# Patient Record
Sex: Female | Born: 1970 | Race: White | Hispanic: No | Marital: Married | Smoking: Never smoker
Health system: Southern US, Community
[De-identification: ages and names within clinical notes are randomized; demographics above are authoritative.]

---

## 2009-05-17 ENCOUNTER — Emergency Department: Payer: Self-pay | Admitting: Emergency Medicine

## 2012-08-02 ENCOUNTER — Inpatient Hospital Stay: Payer: Self-pay | Admitting: Obstetrics and Gynecology

## 2012-08-02 LAB — CBC WITH DIFFERENTIAL/PLATELET
Basophil %: 0.3 %
Eosinophil #: 0 10*3/uL (ref 0.0–0.7)
Eosinophil %: 0.3 %
HCT: 37.1 % (ref 35.0–47.0)
Lymphocyte #: 1.2 10*3/uL (ref 1.0–3.6)
Lymphocyte %: 12 %
MCH: 30 pg (ref 26.0–34.0)
MCHC: 34 g/dL (ref 32.0–36.0)
Monocyte %: 6.2 %
Neutrophil #: 8.2 10*3/uL — ABNORMAL HIGH (ref 1.4–6.5)
Neutrophil %: 81.2 %
Platelet: 185 10*3/uL (ref 150–440)
RBC: 4.21 10*6/uL (ref 3.80–5.20)
RDW: 14.5 % (ref 11.5–14.5)

## 2012-08-04 LAB — CBC WITH DIFFERENTIAL/PLATELET
Eosinophil #: 0 10*3/uL (ref 0.0–0.7)
Eosinophil %: 0.1 %
HGB: 12.8 g/dL (ref 12.0–16.0)
MCH: 30.2 pg (ref 26.0–34.0)
MCHC: 34.3 g/dL (ref 32.0–36.0)
MCV: 88 fL (ref 80–100)
Neutrophil %: 84.7 %
Platelet: 168 10*3/uL (ref 150–440)
RBC: 4.23 10*6/uL (ref 3.80–5.20)
RDW: 14.6 % — ABNORMAL HIGH (ref 11.5–14.5)
WBC: 11.1 10*3/uL — ABNORMAL HIGH (ref 3.6–11.0)

## 2012-08-06 LAB — HEMATOCRIT: HCT: 34.3 % — ABNORMAL LOW (ref 35.0–47.0)

## 2012-08-08 LAB — PATHOLOGY REPORT

## 2013-12-15 ENCOUNTER — Encounter: Payer: Self-pay | Admitting: Surgery

## 2013-12-20 LAB — WOUND AEROBIC CULTURE

## 2013-12-28 ENCOUNTER — Encounter: Payer: Self-pay | Admitting: Surgery

## 2014-07-20 NOTE — Op Note (Signed)
PATIENT NAME:  Natalie Garrett, Natalie Garrett MR#:  [id number] DATE OF BIRTH:  [birth year]  DATE OF PROCEDURE:    PREOPERATIVE DIAGNOSIS:  Arrest of second stage of labor.   POSTOPERATIVE DIAGNOSIS:  Arrest of second stage of labor.  PROCEDURE:  Low transverse cesarean section and bilateral tubal ligation.    ANESTHESIA:  Epidural.   SURGEON:  Senaida Lange, M.D.   ASSISTANTLawson Fiscal, scrub tech.  ESTIMATED BLOOD LOSS:  500 mL.   OPERATIVE FLUIDS:  1 liter.   COMPLICATIONS:  None.   FINDINGS:  Vertex female infant, 3710 grams, Apgars 9 and 9, normal uterus, tubes, and ovaries, two fibroids-14 cm right fundal, one 1-cm posterior right fundal.    SPECIMEN:  Portion of right and left tube, cord blood and gas.   INDICATIONS:  The patient is a 44 year old who presents for induction of labor for post-dates.  The patient had Cervidil and Pitocin induction over a two-day period.  She did not progress passed 6 cm after adequate labor of 16 hours and was therefore delivered via cesarean section.  Risks, benefits, indications, and alternatives of the procedure were explained and informed consent was obtained.   DESCRIPTION OF PROCEDURE:  The patient was taken to the operating room with IV fluids running.  Spinal epidural was dosed up.  She was prepped and draped in the usual sterile fashion with a leftward tilt.  A Pfannenstiel skin incision was made and carried down to the underlying fascia with the knife.  The fascia was nicked in the midline.  The incision was extended laterally.  The superior aspect of the fascia was grasped with Kocher clamps and the underlying rectus muscle dissected off.  This was repeated on the inferior fascia.  The rectus muscles were divided in the midline.  The peritoneum was entered bluntly.  The opening was extended.  A bladder blade was placed.  The vesicouterine peritoneum was grasped with the pick-ups and entered sharply with the Metzenbaum.  The bladder flap was created digitally.   The bladder blade was replaced and the hysterotomy incision was made with one swipe of the scalpel.  At which point, thick green pea soup meconium spilled from the incision.  The incision was extended.  The infant's head was grasped and delivered atraumatically through the hysterotomy incision.  Nuchal cord x 1 was reduced.  The anterior and posterior shoulders were removed followed by the remainder of the body.  The cord was clamped x 2 and cut and the infant was handed to the awaiting nursery staff.  A portion of the cord was obtained for cord gas.  The placenta was expressed.  The uterus was exteriorized and cleared of all clot and debris.  The hysterotomy incision was repaired with #0 Monocryl in a running locked fashion.  Attention was turned to the patient's right tube where it was grasped with Tanja Port and an opening made in the avascular window in the mesosalpinx.  Two pieces of plain gut suture were passed through the opening and the tube was tied to approximately 2 to 3 cm lateral to the uterine cornu.  This was repeated on the patient's left tube.  The uterus was returned to the abdomen.  The abdomen and gutters were irrigated with copious amounts of warm normal saline.  The peritoneum was repaired with a #2-0.  The On-Q pump apparatus was placed according to manufacturer's instructions.  The fascia was repaired with a #1 PDS.  The skin was closed with staples.  The  subcutaneous tissue had been reapproximated with #2-0 Vicryl.  The On-Q pump catheters were each bolused with 0.5% Sensorcaine.  The openings through which the catheters were placed on the skin were closed with Dermabond skin glue and the catheters were secured to the patient's abdomen using Steri-Strips and Tegaderm.  The patient tolerated the procedure well.  Sponge, lap, needle and instrument counts were correct x 2 and the patient was taken to the recovery room in stable condition.      ____________________________ Sonda PrimesLashawn A.  Patton SallesWeaver-Lee, MD law:ea D: 08/05/2012 01:46:18 ET T: 08/05/2012 04:32:29 ET JOB#: [id number]  cc: Flint MelterLashawn A. Patton SallesWeaver-Lee, MD, <Dictator> Janyth ContesLASHAWN A WEAVER LEE MD ELECTRONICALLY SIGNED 08/09/2012 14:27

## 2017-08-22 ENCOUNTER — Other Ambulatory Visit
Admission: RE | Admit: 2017-08-22 | Discharge: 2017-08-22 | Disposition: A | Discharge status: Home / Self Care | Payer: Commercial Managed Care - PPO | Attending: *Deleted | Admitting: *Deleted

## 2017-08-22 ENCOUNTER — Emergency Department
Admission: EM | Admit: 2017-08-22 | Discharge: 2017-08-22 | Disposition: A | Discharge status: Home / Self Care | Payer: Commercial Managed Care - PPO | Attending: Emergency Medicine | Admitting: Emergency Medicine

## 2017-08-22 ENCOUNTER — Emergency Department: Payer: Commercial Managed Care - PPO

## 2017-08-22 ENCOUNTER — Encounter: Payer: Self-pay | Admitting: Emergency Medicine

## 2017-08-22 DIAGNOSIS — H16001 Unspecified corneal ulcer, right eye: Secondary | ICD-10-CM | POA: Diagnosis not present

## 2017-08-22 DIAGNOSIS — H5711 Ocular pain, right eye: Secondary | ICD-10-CM | POA: Diagnosis present

## 2017-08-22 LAB — BASIC METABOLIC PANEL
Anion gap: 5 (ref 5–15)
BUN: 10 mg/dL (ref 6–20)
CO2: 26 mmol/L (ref 22–32)
CREATININE: 0.55 mg/dL (ref 0.44–1.00)
Calcium: 8.8 mg/dL — ABNORMAL LOW (ref 8.9–10.3)
Chloride: 106 mmol/L (ref 101–111)
GFR calc Af Amer: >60 mL/min (ref >60)
Glucose, Bld: 106 mg/dL — ABNORMAL HIGH (ref 65–99)
POTASSIUM: 4 mmol/L (ref 3.5–5.1)
SODIUM: 137 mmol/L (ref 135–145)

## 2017-08-22 LAB — CBC WITH DIFFERENTIAL/PLATELET
BASOS ABS: 0 10*3/uL (ref 0–0.1)
Basophils Relative: 0 %
EOS ABS: 0.1 10*3/uL (ref 0–0.7)
EOS PCT: 1 %
HCT: 40 % (ref 35.0–47.0)
Hemoglobin: 13.6 g/dL (ref 12.0–16.0)
Lymphocytes Relative: 14 %
Lymphs Abs: 1 10*3/uL (ref 1.0–3.6)
MCH: 29.3 pg (ref 26.0–34.0)
MCHC: 34 g/dL (ref 32.0–36.0)
MCV: 86.2 fL (ref 80.0–100.0)
Monocytes Absolute: 0.4 10*3/uL (ref 0.2–0.9)
Monocytes Relative: 6 %
Neutro Abs: 5.6 10*3/uL (ref 1.4–6.5)
Neutrophils Relative %: 79 %
PLATELETS: 241 10*3/uL (ref 150–440)
RBC: 4.64 MIL/uL (ref 3.80–5.20)
RDW: 13.9 % (ref 11.5–14.5)
WBC: 7.1 10*3/uL (ref 3.6–11.0)

## 2017-08-22 MED ORDER — FLUORESCEIN SODIUM 1 MG OP STRP
1.0000 | ORAL_STRIP | Freq: Once | OPHTHALMIC | Status: AC
  Administered 2017-08-22: 1 via OPHTHALMIC
  Filled 2017-08-22: qty 1

## 2017-08-22 MED ORDER — TETRACAINE HCL 0.5 % OP SOLN
2.0000 [drp] | Freq: Once | OPHTHALMIC | Status: AC
  Administered 2017-08-22: 2 [drp] via OPHTHALMIC
  Filled 2017-08-22: qty 4

## 2017-08-22 MED ORDER — MOXIFLOXACIN HCL 0.5 % OP SOLN
1.0000 [drp] | Freq: Three times a day (TID) | OPHTHALMIC | Status: DC
  Filled 2017-08-22: qty 3

## 2017-08-22 MED ORDER — IOPAMIDOL (ISOVUE-300) INJECTION 61%
75.0000 mL | Freq: Once | INTRAVENOUS | Status: AC | PRN
  Administered 2017-08-22: 75 mL via INTRAVENOUS
  Filled 2017-08-22: qty 75

## 2017-08-22 NOTE — ED Provider Notes (Signed)
[facility] Emergency Department Provider Note  ____________________________________________  Time seen: Approximately 3:32 PM  I have reviewed the triage vital signs and the nursing notes.   HISTORY  Chief Complaint Eye Pain    HPI Natalie Garrett is a 47 y.o. female that presents emergency department for evaluation of right eye pain, foreign body sensation for 2 days.  She is having difficulty opening her eye due to swelling.  She has had yellow drainage from eye today.  Patient denies trauma to her eye.  She does not wear contacts. No floaters, flashers, nausea, vomiting.  History reviewed. No pertinent past medical history.  There are no active problems to display for this patient.   Past Surgical History:  Procedure Laterality Date  . CESAREAN SECTION      Prior to Admission medications   Not on File    Allergies Patient has no known allergies.  History reviewed. No pertinent family history.  Social History Social History   Tobacco Use  . Smoking status: Never Smoker  . Smokeless tobacco: Never Used  Substance Use Topics  . Alcohol use: Never    Frequency: Never  . Drug use: Never     Review of Systems  Constitutional: No fever/chills Respiratory: No SOB. Gastrointestinal: No nausea, no vomiting.  Skin: Negative for rash, abrasions, lacerations, ecchymosis. Neurological: Negative for headaches   ____________________________________________   PHYSICAL EXAM:  VITAL SIGNS: ED Triage Vitals  Enc Vitals Group     BP 08/22/17 0755 120/76     Pulse Rate 08/22/17 0755 (!) 55     Resp 08/22/17 0755 18     Temp 08/22/17 0755 98.6 F (37 C)     Temp Source 08/22/17 0755 Oral     SpO2 08/22/17 0755 97 %     Weight 08/22/17 0817 200 lb (90.7 kg)     Height 08/22/17 0817  (1.549 m)     Head Circumference --      Peak Flow --      Pain Score 08/22/17 0816 3     Pain Loc --      Pain Edu? --      Excl. in GC? --       Constitutional: Alert and oriented. Well appearing and in no acute distress. Eyes: Right conjunctivae is injected. Opacity seen at 7:00 position of iris. Yellow drainage in eye. PERRL. EOMI. Moderate swelling around orbit.  Head: Atraumatic. ENT:      Ears:      Nose: No congestion/rhinnorhea.      Mouth/Throat: Mucous membranes are moist.  Neck: No stridor.   Cardiovascular:  Good peripheral circulation. Respiratory: Normal respiratory effort without tachypnea or retractions.  Musculoskeletal: Full range of motion to all extremities. No gross deformities appreciated. Neurologic:  Normal speech and language. No gross focal neurologic deficits are appreciated.  Skin:  Skin is warm, dry and intact. No rash noted. Psychiatric: Mood and affect are normal. Speech and behavior are normal. Patient exhibits appropriate insight and judgement.   ____________________________________________   LABS (all labs ordered are listed, but only abnormal results are displayed)  Labs Reviewed  BASIC METABOLIC PANEL - Abnormal; Notable for the following components:      Result Value   Glucose, Bld 106 (*)    Calcium 8.8 (*)    All other components within normal limits  AEROBIC CULTURE (SUPERFICIAL SPECIMEN)  CBC WITH DIFFERENTIAL/PLATELET   ______________________________[facility]_____________________________________  RADIOLOGY   Ct Orbits  W Contrast  Result Date: 08/22/2017 CLINICAL DATA:  Right orbital swelling and pain for 24 hours. EXAM: CT ORBITS WITH CONTRAST TECHNIQUE: Multidetector CT images was performed according to the standard protocol following intravenous contrast administration. CONTRAST:  75mL ISOVUE-300 IOPAMIDOL (ISOVUE-300) INJECTION 61% COMPARISON:  None. FINDINGS: Orbits: Preseptal right periorbital soft tissue swelling is present. There is no postseptal or retro-orbital inflammation. No focal abscess is present. Globes and orbits are within normal  limits. Lenses are located. Extraocular muscles are within normal limits. Optic nerve is normal bilaterally. Visualized sinuses: The paranasal sinuses visualized mastoid air cells are clear. Soft tissues: Soft tissues the face are otherwise unremarkable. Limited intracranial: Visualized intracranial contents are normal. Cavernous sinus is normal bilaterally. Sella is unremarkable. IMPRESSION: 1. Periorbital stranding compatible with preseptal periorbital cellulitis. No postseptal or retro orbital inflammation is present. Electronically Signed   By: Marin Roberts M.D.   On: 08/22/2017 11:06    ____________________________________________    PROCEDURES  Procedure(s) performed:    Procedures    Medications  fluorescein ophthalmic strip 1 strip (1 strip Right Eye Given by Other 08/22/17 1030)  tetracaine (PONTOCAINE) 0.5 % ophthalmic solution 2 drop (2 drops Right Eye Given 08/22/17 1030)  iopamidol (ISOVUE-300) 61 % injection 75 mL (75 mLs Intravenous Contrast Given 08/22/17 1017)     ____________________________________________   INITIAL IMPRESSION / ASSESSMENT AND PLAN / ED COURSE  Pertinent labs & imaging results that were available during my care of the patient were reviewed by me and considered in my medical decision making (see chart for details).  Review of the [place] CSRS was performed in accordance of the [facility] prior to dispensing any controlled drugs.     Patient's diagnosis is consistent with corneal ulcer. Vital signs and exam are reassuring.  CT was ordered to evaluate for orbital cellulitis.  Dr. Sharman Crate was consulted.  Patient went to Dr. Reynold Bowen office today at 1230 for appointment.  Dr. Sharman Crate stopped to update me after appointment to drop off cultures for lab.  He states that patient was discharged with antibiotics and will have a follow-up appointment again with him tomorrow.       ____________________________________________  FINAL CLINICAL  IMPRESSION(S) / ED DIAGNOSES  Final diagnoses:  Ulcer of right cornea      NEW MEDICATIONS STARTED DURING THIS VISIT:  ED Discharge Orders    None          This chart was dictated using voice recognition software/Dragon. Despite best efforts to proofread, errors can occur which can change the meaning. Any change was purely unintentional.    Enid Derry, PA-C 08/22/17 1536    Sharyn Creamer, MD 08/22/17 1606

## 2017-08-22 NOTE — ED Notes (Signed)

## 2017-08-22 NOTE — Discharge Instructions (Addendum)
You have an appointment with Dr. Inez Pilgrim today at 12:30. You will meet him on the right side of the building at the after hours entrance by the employee parking lot.

## 2017-08-22 NOTE — ED Notes (Signed)
MAR saying vigamox drops was dispensed, however unable to locate med in any of ED tube stations, med bins, or nurses' stations. Spoke with patient and explained delay. Both patient and staff in agreement that pt needs to leave to make ophthalmologist appt on time. EDP Wagner also in agreement.

## 2017-08-22 NOTE — ED Triage Notes (Signed)
Pt presents to ED c/o R eye irritation and swelling starting yesterday. Reports only tearing, no purulent drainage.

## 2017-08-26 LAB — AEROBIC CULTURE W GRAM STAIN (SUPERFICIAL SPECIMEN): Special Requests: NORMAL

## 2017-08-26 LAB — AEROBIC CULTURE  (SUPERFICIAL SPECIMEN)

## 2017-09-12 LAB — CULTURE, FUNGUS WITHOUT SMEAR

## 2019-02-28 IMAGING — CT CT ORBITS W/ CM
3 series · 15 of 47 positions shown, 18 images · IV contrast (iopamidol)
Comparison: None.

CLINICAL DATA: Right orbital swelling and pain for 24 hours.

EXAM:
CT ORBITS WITH CONTRAST
TECHNIQUE: Multidetector CT images was performed according to the standard
protocol following intravenous contrast administration.
CONTRAST:  75mL 11RS7T-9II IOPAMIDOL (11RS7T-9II) INJECTION 61%

[Series 3: orbits 2.0 h30s st · axial · 0.33mm/px · z∈[-137,-39]mm · 9 of 57 slices shown, 12 images]
[im 4/57  brain]
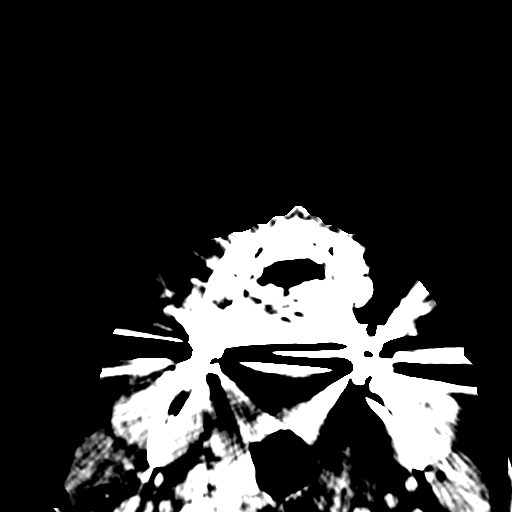
[im 4/57  bone]
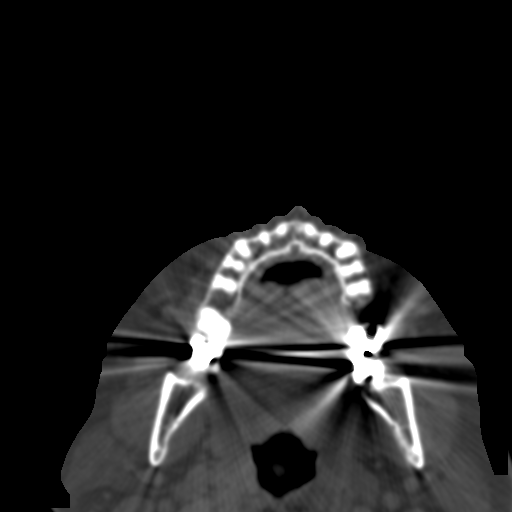
[im 10/57  bone]
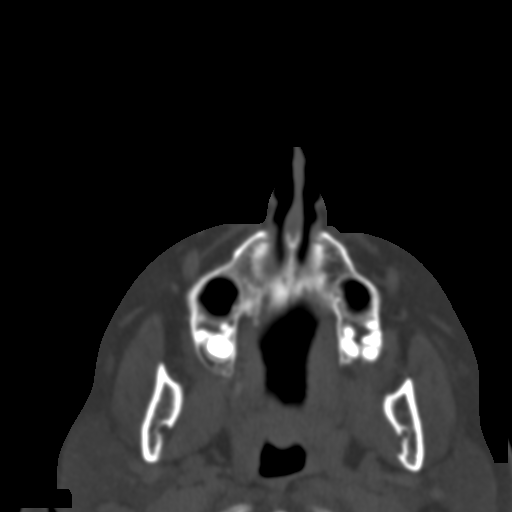
[im 16/57  bone]
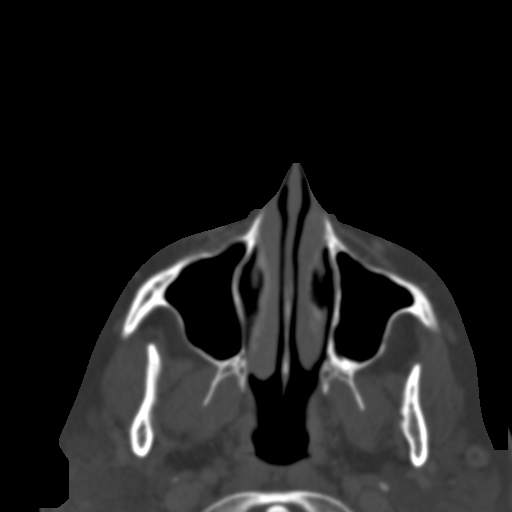
[im 22/57  bone]
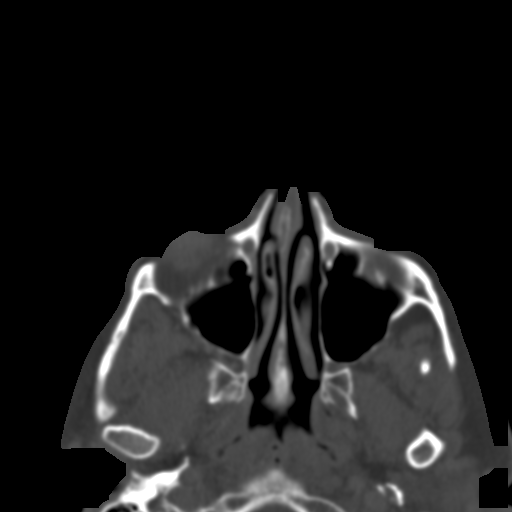
[im 29/57  brain]
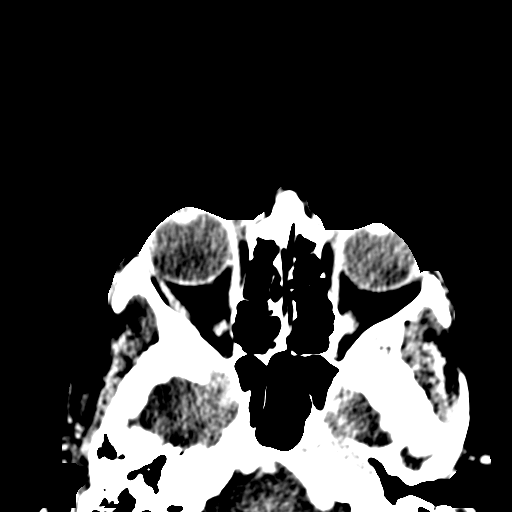
[im 29/57  bone]
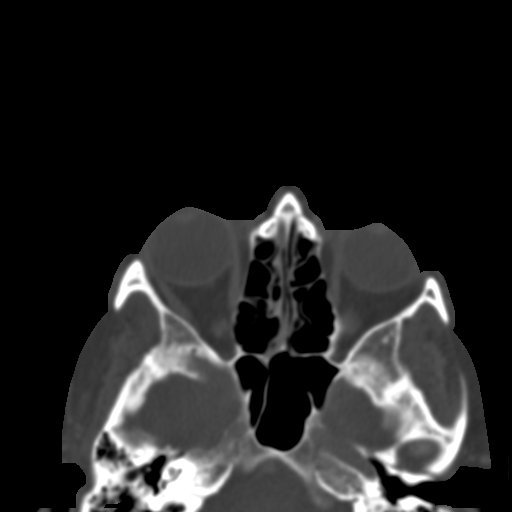
[im 35/57  bone]
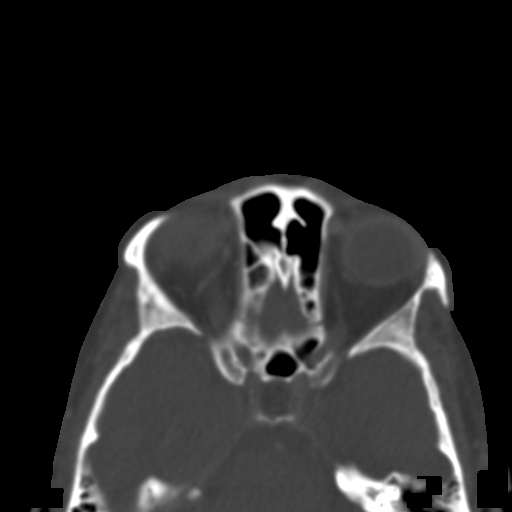
[im 41/57  bone]
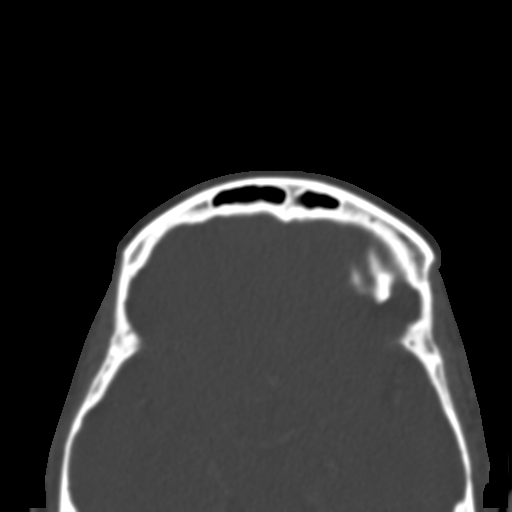
[im 47/57  bone]
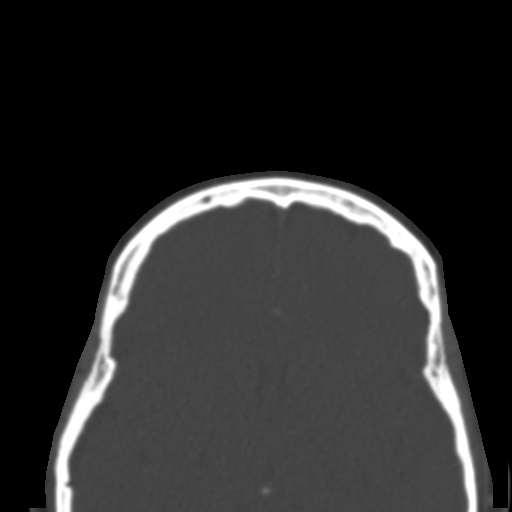
[im 53/57  brain]
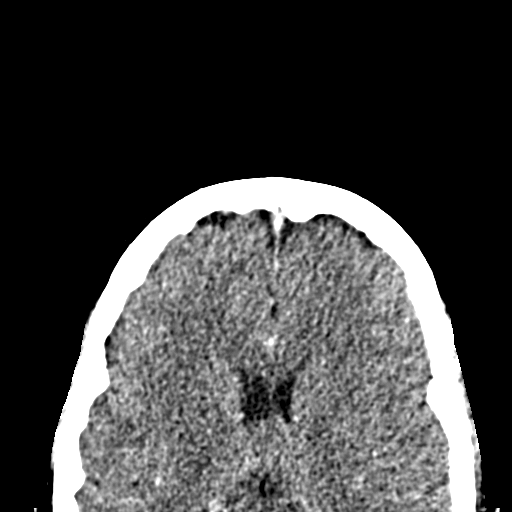
[im 53/57  bone]
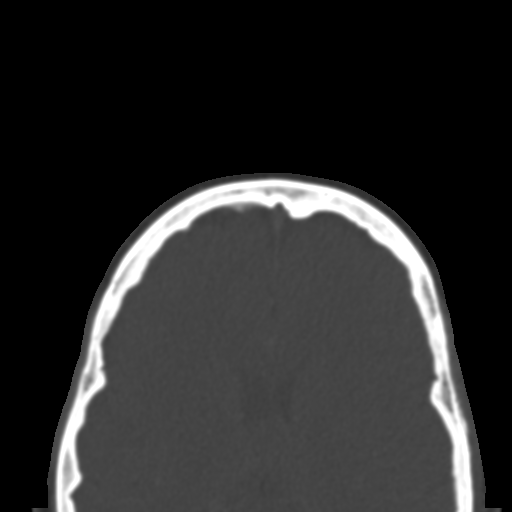

[Series 8: orbits 2.0 coronal · coronal · 0.26mm/px · 3 of 76 slices shown]
[im 26/76  bone]
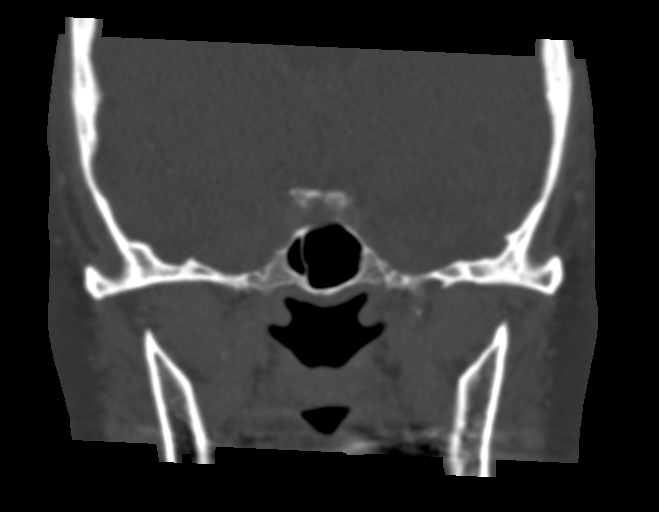
[im 34/76  bone]
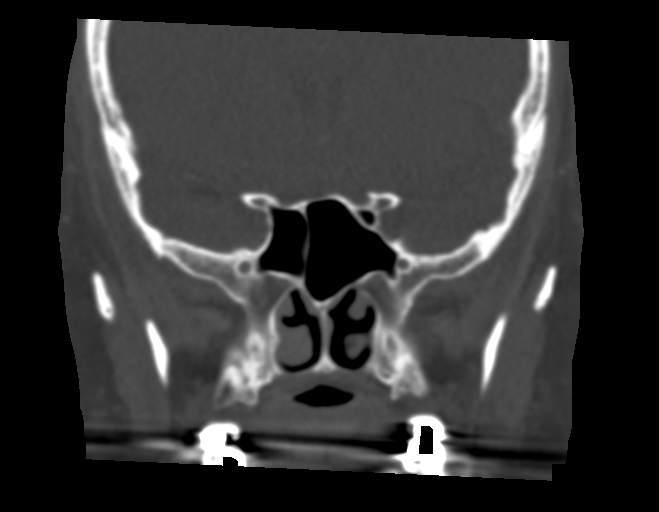
[im 42/76  bone]
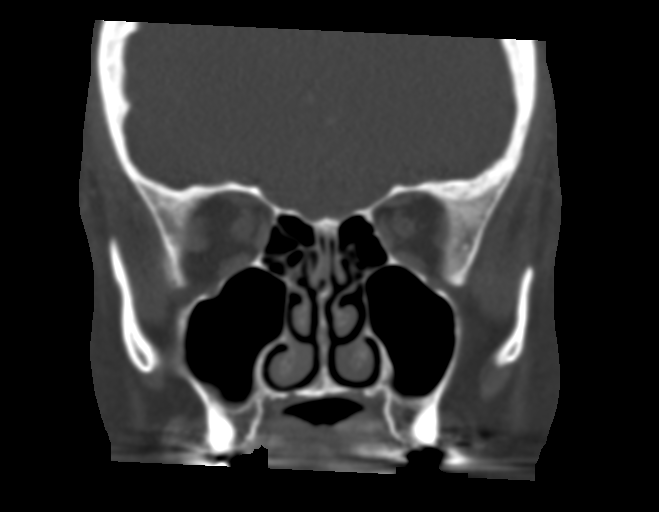

[Series 9: orbits 2.0 sagittal · sagittal · 0.26mm/px · 3 of 85 slices shown]
[im 29/85  bone]
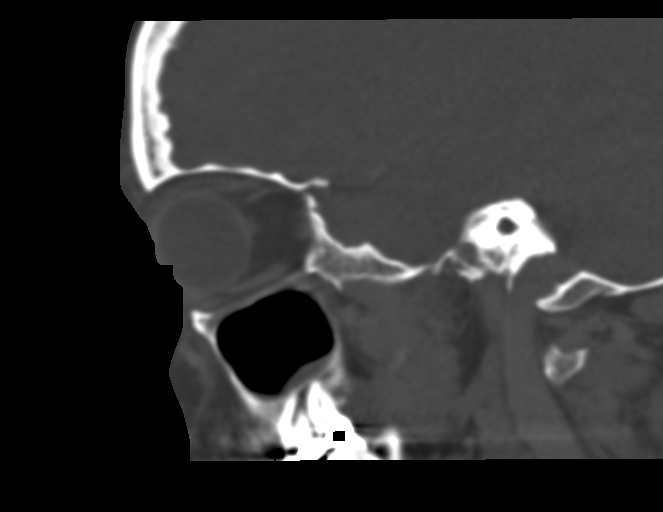
[im 43/85  bone]
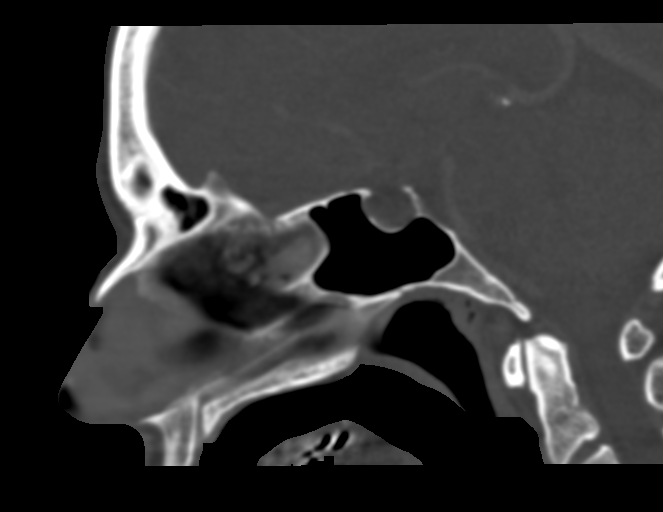
[im 57/85  bone]
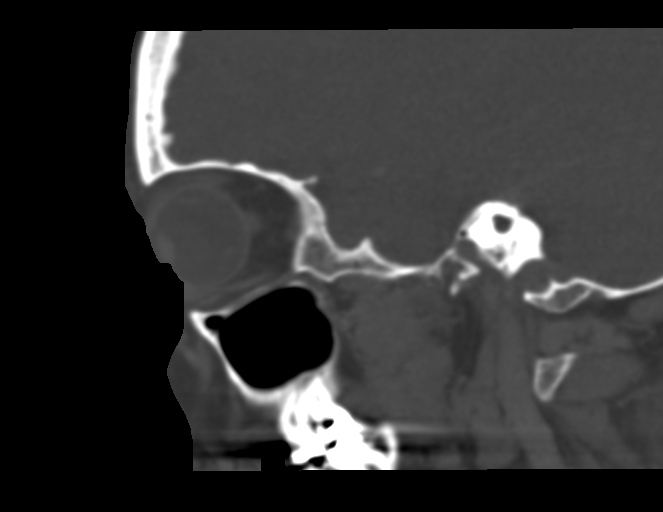

[15 of 47 positions shown; findings below may reference images not displayed]

FINDINGS: Orbits: Preseptal right periorbital soft tissue swelling is present.
There is no postseptal or retro-orbital inflammation. No focal
abscess is present. Globes and orbits are within normal limits.
Lenses are located. Extraocular muscles are within normal limits.
Optic nerve is normal bilaterally.

Visualized sinuses: The paranasal sinuses visualized mastoid air
cells are clear.

Soft tissues: Soft tissues the face are otherwise unremarkable.

Limited intracranial: Visualized intracranial contents are normal.
Cavernous sinus is normal bilaterally. Sella is unremarkable.
IMPRESSION: 1. Periorbital stranding compatible with preseptal periorbital
cellulitis. No postseptal or retro orbital inflammation is present.

## 2020-03-06 ENCOUNTER — Other Ambulatory Visit: Payer: Self-pay | Admitting: Nurse Practitioner

## 2020-03-06 DIAGNOSIS — Z1231 Encounter for screening mammogram for malignant neoplasm of breast: Secondary | ICD-10-CM
